# Patient Record
Sex: Male | Born: 1975 | Race: Black or African American | Hispanic: No | Marital: Single | Smoking: Current every day smoker
Health system: Southern US, Community
[De-identification: ages and names within clinical notes are randomized; demographics above are authoritative.]

## PROBLEM LIST (undated history)

## (undated) HISTORY — PX: PERIPHERAL VASCULAR THROMBECTOMY: CATH118306

## (undated) HISTORY — PX: OTHER SURGICAL HISTORY: SHX169

---

## 2018-02-18 ENCOUNTER — Emergency Department (HOSPITAL_COMMUNITY)
Admission: EM | Admit: 2018-02-18 | Discharge: 2018-02-18 | Disposition: A | Discharge status: Home / Self Care | Payer: Self-pay | Attending: Emergency Medicine | Admitting: Emergency Medicine

## 2018-02-18 ENCOUNTER — Emergency Department (HOSPITAL_COMMUNITY): Payer: Self-pay

## 2018-02-18 ENCOUNTER — Encounter (HOSPITAL_COMMUNITY): Payer: Self-pay

## 2018-02-18 ENCOUNTER — Other Ambulatory Visit: Payer: Self-pay

## 2018-02-18 DIAGNOSIS — M25552 Pain in left hip: Secondary | ICD-10-CM | POA: Insufficient documentation

## 2018-02-18 DIAGNOSIS — M85852 Other specified disorders of bone density and structure, left thigh: Secondary | ICD-10-CM | POA: Insufficient documentation

## 2018-02-18 DIAGNOSIS — F17210 Nicotine dependence, cigarettes, uncomplicated: Secondary | ICD-10-CM | POA: Insufficient documentation

## 2018-02-18 DIAGNOSIS — G8929 Other chronic pain: Secondary | ICD-10-CM | POA: Insufficient documentation

## 2018-02-18 MED ORDER — LIDOCAINE 5 % EX PTCH: 1.0000 | MEDICATED_PATCH | CUTANEOUS | 0 refills | Status: AC

## 2018-02-18 MED ORDER — MELOXICAM 7.5 MG PO TABS: 7.5000 mg | ORAL_TABLET | Freq: Every day | ORAL | 0 refills | Status: AC

## 2018-02-18 NOTE — ED Notes (Signed)
Bed: [id number] Expected date: 02/18/18 Expected time: 11:00 AM Means of arrival:  Comments:
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
## 2018-02-18 NOTE — Discharge Instructions (Signed)
Take naproxen once a day with meals.  Do not take other anti-inflammatories at the same time open (Advil, Motrin, ibuprofen, naproxen, Aleve). You may supplement with Tylenol if you need further pain control. Use ice packs or heating pads if this helps control your pain. Use lidocaine patches to help with pain.  Follow up with orthopedics for further evaluation and management of your hip. Return to the ER if you develop fevers, numbness, or any new or concerning symptoms.

## 2020-02-04 IMAGING — CR DG HIP (WITH OR WITHOUT PELVIS) 2-3V*L*
3 series · 3 of 3 positions shown · non-contrast
Comparison: None.

CLINICAL DATA: Left hip pain for months

EXAM:
DG HIP (WITH OR WITHOUT PELVIS) 2-3V LEFT

[t pelvis ap]
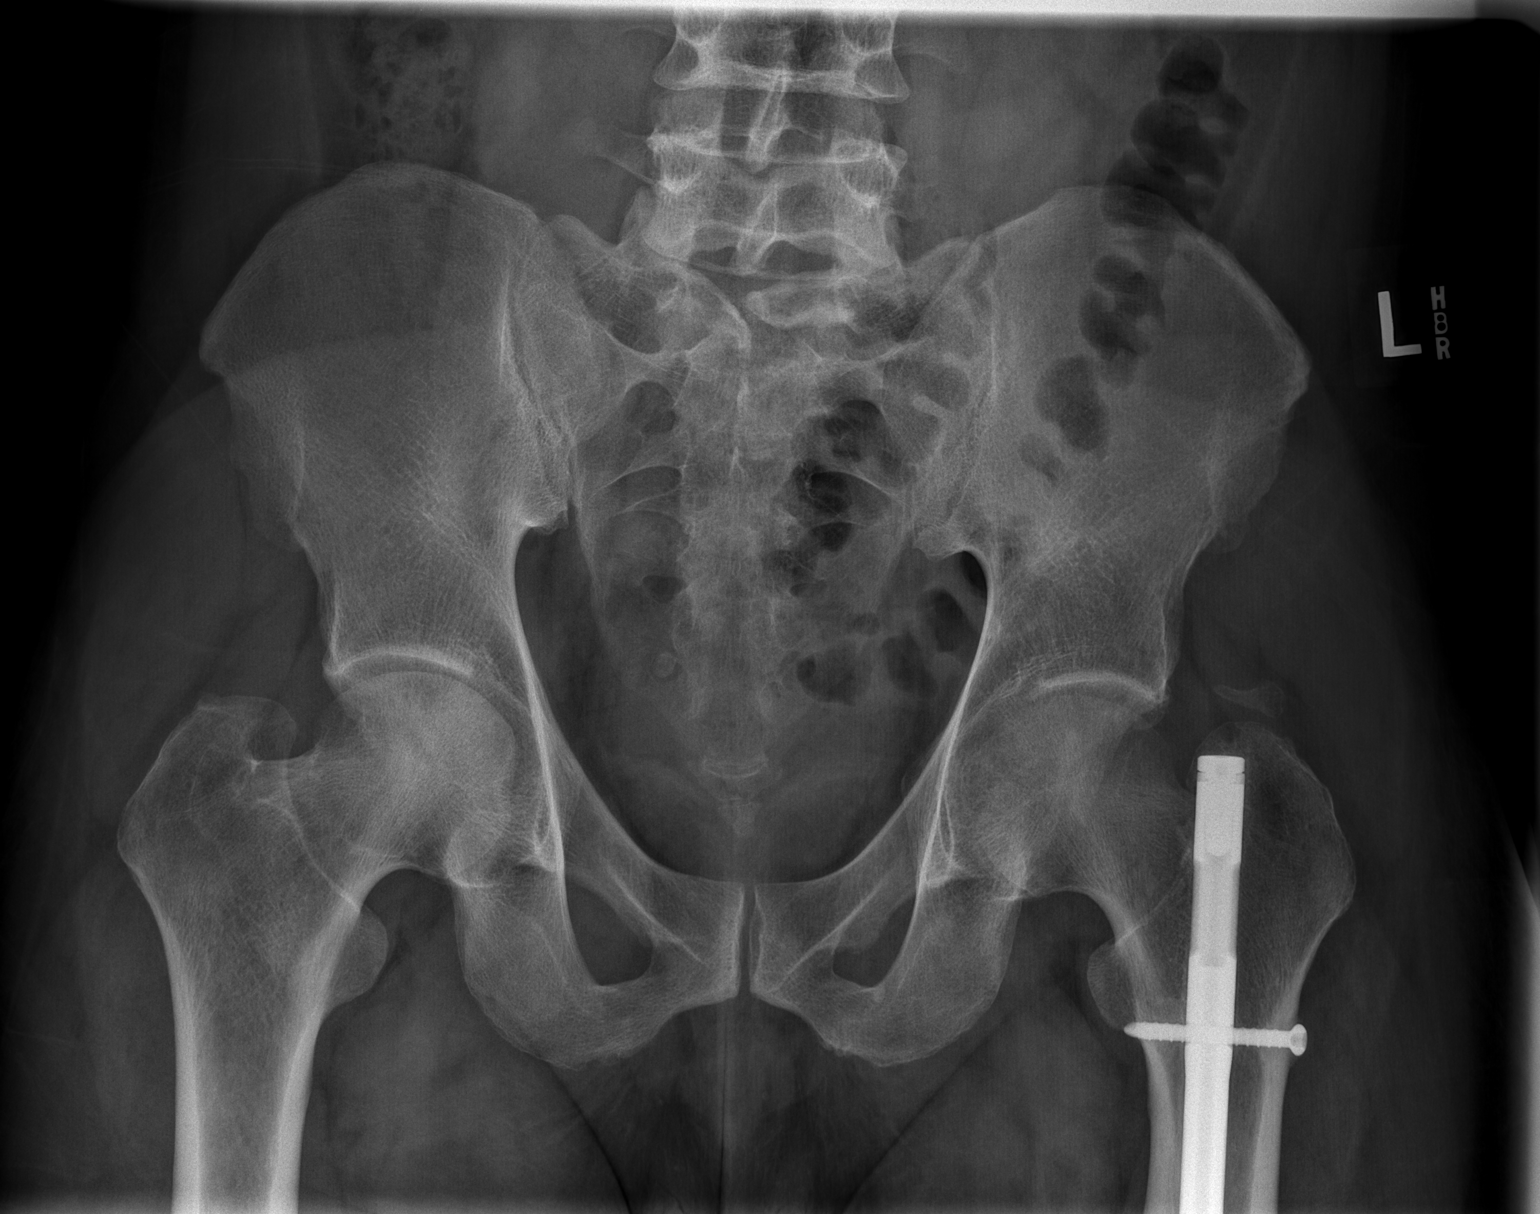

[t hip ap left]
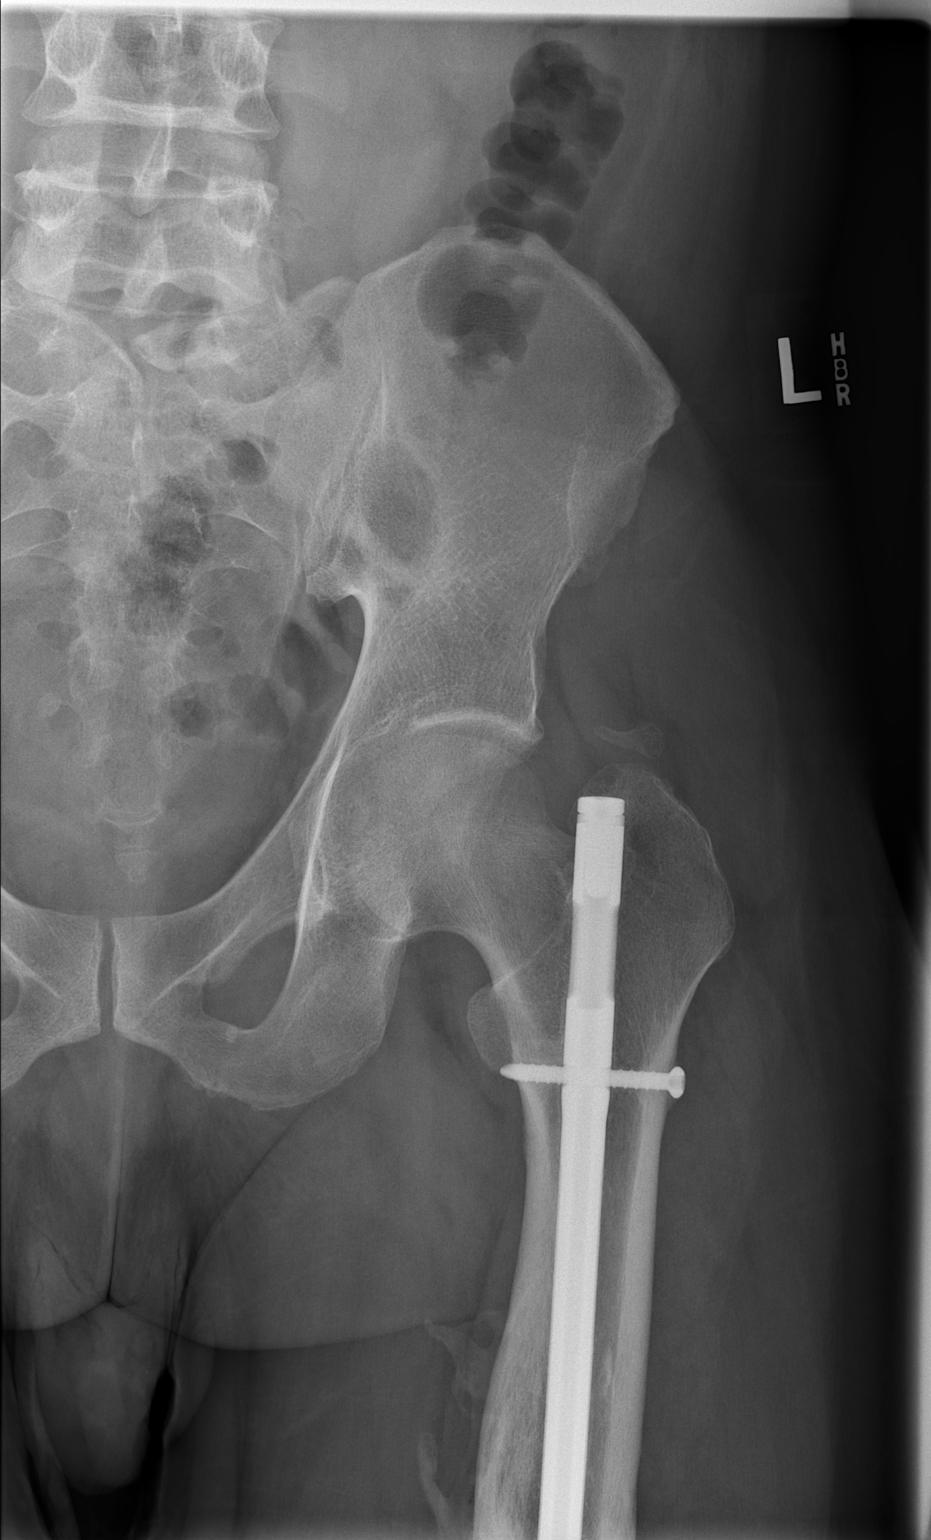

[t hip frog leg left]
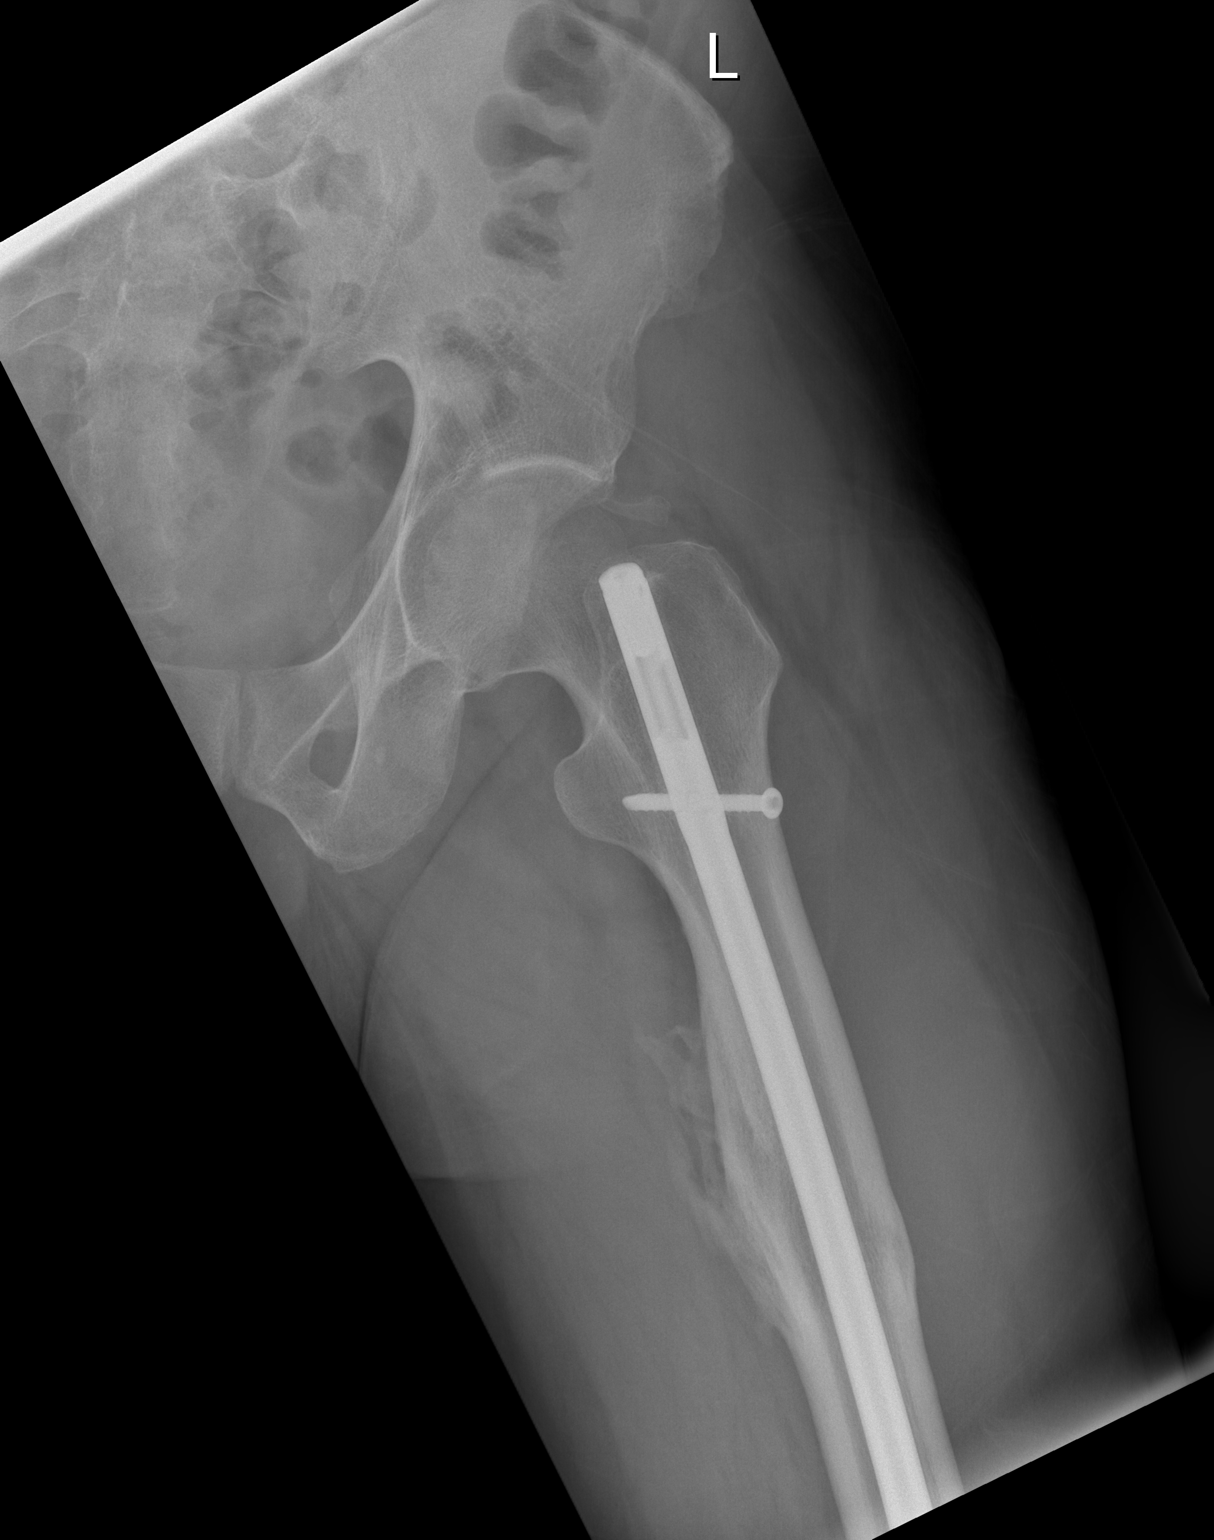

[3 of 3 positions shown; findings below may reference images not displayed]

FINDINGS: Remote femoral shaft fracture with intramedullary nail fixation.
Fracture is healed with regional heterotopic ossification. No
evidence of fracture, erosion, or bone lesion. Notable asymmetric
osteopenia of the proximal left femur.
IMPRESSION: 1. Osteopenia of the proximal left femur which could be transient
osteoporosis of the hip. Consider MR follow-up.
2. Remote and healed left femoral shaft fracture.

## 2022-11-28 DEATH — deceased
